# Patient Record
Sex: Female | Born: 1970 | Race: Asian | Hispanic: No | Marital: Married | Smoking: Former smoker
Health system: Southern US, Community
[De-identification: ages and names within clinical notes are randomized; demographics above are authoritative.]

## PROBLEM LIST (undated history)

## (undated) HISTORY — PX: TUBAL LIGATION: SHX77

---

## 2013-07-06 DIAGNOSIS — S8290XD Unspecified fracture of unspecified lower leg, subsequent encounter for closed fracture with routine healing: Secondary | ICD-10-CM | POA: Insufficient documentation

## 2013-07-06 DIAGNOSIS — M6281 Muscle weakness (generalized): Secondary | ICD-10-CM | POA: Insufficient documentation

## 2013-07-06 DIAGNOSIS — M222X1 Patellofemoral disorders, right knee: Secondary | ICD-10-CM | POA: Insufficient documentation

## 2017-11-19 ENCOUNTER — Emergency Department (HOSPITAL_COMMUNITY)
Admission: EM | Admit: 2017-11-19 | Discharge: 2017-11-19 | Disposition: A | Discharge status: Home / Self Care | Payer: 59 | Attending: Emergency Medicine | Admitting: Emergency Medicine

## 2017-11-19 ENCOUNTER — Encounter (HOSPITAL_COMMUNITY): Payer: Self-pay

## 2017-11-19 ENCOUNTER — Other Ambulatory Visit: Payer: Self-pay

## 2017-11-19 DIAGNOSIS — M62830 Muscle spasm of back: Secondary | ICD-10-CM | POA: Insufficient documentation

## 2017-11-19 DIAGNOSIS — Z87891 Personal history of nicotine dependence: Secondary | ICD-10-CM | POA: Diagnosis not present

## 2017-11-19 DIAGNOSIS — M549 Dorsalgia, unspecified: Secondary | ICD-10-CM | POA: Diagnosis not present

## 2017-11-19 MED ORDER — CYCLOBENZAPRINE HCL 10 MG PO TABS: 10.0000 mg | ORAL_TABLET | Freq: Two times a day (BID) | ORAL | 0 refills | Status: DC | PRN

## 2017-11-19 MED ORDER — KETOROLAC TROMETHAMINE 30 MG/ML IJ SOLN
15.0000 mg | Freq: Once | INTRAMUSCULAR | Status: AC
  Administered 2017-11-19: 15 mg via INTRAMUSCULAR
  Filled 2017-11-19: qty 1

## 2017-11-19 MED ORDER — PREDNISONE 10 MG PO TABS: 40.0000 mg | ORAL_TABLET | Freq: Every day | ORAL | 0 refills | Status: DC

## 2017-11-19 MED ORDER — HYDROCODONE-ACETAMINOPHEN 5-325 MG PO TABS
1.0000 | ORAL_TABLET | Freq: Once | ORAL | Status: AC
  Administered 2017-11-19: 1 via ORAL
  Filled 2017-11-19: qty 1

## 2017-11-19 MED ORDER — CYCLOBENZAPRINE HCL 10 MG PO TABS
10.0000 mg | ORAL_TABLET | Freq: Once | ORAL | Status: AC
  Administered 2017-11-19: 10 mg via ORAL
  Filled 2017-11-19: qty 1

## 2017-11-19 MED ORDER — PREDNISONE 10 MG PO TABS: 40.0000 mg | ORAL_TABLET | Freq: Every day | ORAL | 0 refills | Status: AC

## 2017-11-19 MED ORDER — DICLOFENAC SODIUM 50 MG PO TBEC: 50.0000 mg | DELAYED_RELEASE_TABLET | Freq: Two times a day (BID) | ORAL | 0 refills | Status: AC

## 2017-11-19 NOTE — ED Notes (Signed)
Pt stated that she has had a tubal ligation and last period 2 weeks ago

## 2017-11-19 NOTE — ED Triage Notes (Signed)
Pt reports low back pain with spasm. Pt reports having to sleep on a air mattress for about 1.5 weeks. Pt reports worsening back pain. Pt reports that she bent over at work today and began to have a back spasm. Pt denies numbness or tingling.

## 2017-11-19 NOTE — Discharge Instructions (Addendum)
Take the medication as directed. Do not drive while taking the muscle relaxer as it can make you sleepy. Follow up with your doctor or return here for worsening symptoms.

## 2017-11-19 NOTE — ED Provider Notes (Signed)
[facility]-EMERGENCY DEPT Provider Note   CSN: [id number] Arrival date & time: 11/19/17  1404     History   Chief Complaint Chief Complaint  Patient presents with  . Back Pain    HPI Haley Clark is a 47 y.o. female who presents to the ED with back pain and spasm. Patient reports having to sleep on an air mattress for over a week and the pain started doing that time. Patient bent over at work today and started having severe spasm. Patient denies numbness or tingling or loss of control of bladder or bowels.   HPI  History reviewed. No pertinent past medical history.  There are no active problems to display for this patient.   Past Surgical History:  Procedure Laterality Date  . TUBAL LIGATION       OB History   None      Home Medications    Prior to Admission medications   Medication Sig Start Date End Date Taking? Authorizing Provider  cyclobenzaprine (FLEXERIL) 10 MG tablet Take 1 tablet (10 mg total) by mouth 2 (two) times daily as needed for muscle spasms. 11/19/17   Janne Napoleon, NP  diclofenac (VOLTAREN) 50 MG EC tablet Take 1 tablet (50 mg total) by mouth 2 (two) times daily. 11/19/17   Janne Napoleon, NP  predniSONE (DELTASONE) 10 MG tablet Take 4 tablets (40 mg total) by mouth daily with breakfast. 11/19/17   Janne Napoleon, NP    Family History History reviewed. No pertinent family history.  Social History Social History   Tobacco Use  . Smoking status: Former Games developer  . Smokeless tobacco: Never Used  Substance Use Topics  . Alcohol use: Yes    Comment: occ  . Drug use: Never     Allergies   Patient has no known allergies.   Review of Systems Review of Systems  Musculoskeletal: Positive for back pain.  All other systems reviewed and are negative.    Physical Exam Updated Vital Signs BP 126/87 (BP Location: Left Arm)   Pulse 80   Temp 98.9 F (37.2 C) (Oral)   Resp 20   Ht 5\' 8"  (1.727 m)   Wt 55.8 kg   SpO2  100%   BMI 18.70 kg/m   Physical Exam  Constitutional: She is oriented to person, place, and time. She appears well-developed and well-nourished. No distress.  HENT:  Head: Normocephalic.  Eyes: Conjunctivae and EOM are normal.  Neck: Normal range of motion. Neck supple.  Cardiovascular: Normal rate.  Pulmonary/Chest: Effort normal.  Abdominal: Soft. There is no tenderness.  Musculoskeletal:       Lumbar back: She exhibits tenderness and spasm. She exhibits normal pulse. Decreased range of motion: due to pain.  Neurological: She is alert and oriented to person, place, and time. She has normal strength. She displays a negative Romberg sign. Gait normal.  Reflex Scores:      Bicep reflexes are 2+ on the right side and 2+ on the left side.      Brachioradialis reflexes are 2+ on the right side and 2+ on the left side.      Patellar reflexes are 2+ on the right side and 2+ on the left side. Skin: Skin is warm and dry.  Psychiatric: She has a normal mood and affect.  Nursing note and vitals reviewed.    ED Treatments / Results  Labs (all labs ordered are listed, but only abnormal results are displayed) Labs Reviewed -  No data to display  Radiology No results found.  Procedures Procedures (including critical care time)  Medications Ordered in ED Medications  cyclobenzaprine (FLEXERIL) tablet 10 mg (10 mg Oral Given 11/19/17 1548)  ketorolac (TORADOL) 30 MG/ML injection 15 mg (15 mg Intramuscular Given 11/19/17 1548)  HYDROcodone-acetaminophen (NORCO/VICODIN) 5-325 MG per tablet 1 tablet (1 tablet Oral Given 11/19/17 1708)   Patient's pain improved significantly with treatment in the ED.  Initial Impression / Assessment and Plan / ED Course  I have reviewed the triage vital signs and the nursing notes. Patient with back pain.  No neurological deficits and normal neuro exam.  Patient can walk but states is painful.  No loss of bowel or bladder control.  No concern for cauda equina.   No fever, night sweats, weight loss, h/o cancer, IVDU.  RICE protocol and pain medicine indicated and discussed with patient.   Final Clinical Impressions(s) / ED Diagnoses   Final diagnoses:  Spasm of muscle of lower back    ED Discharge Orders         Ordered    cyclobenzaprine (FLEXERIL) 10 MG tablet  2 times daily PRN,   Status:  Discontinued     11/19/17 1659    predniSONE (DELTASONE) 10 MG tablet  Daily with breakfast,   Status:  Discontinued     11/19/17 1659    diclofenac (VOLTAREN) 50 MG EC tablet  2 times daily     11/19/17 1659    cyclobenzaprine (FLEXERIL) 10 MG tablet  2 times daily PRN     11/19/17 1801    predniSONE (DELTASONE) 10 MG tablet  Daily with breakfast     11/19/17 1801           Kerrie Buffaloeese, Tamisha Nordstrom [place], NP 11/19/17 2147    Pricilla LovelessGoldston, Scott, MD 11/20/17 [phone]

## 2017-11-23 ENCOUNTER — Other Ambulatory Visit: Payer: Self-pay

## 2017-11-23 ENCOUNTER — Emergency Department (HOSPITAL_COMMUNITY)
Admission: EM | Admit: 2017-11-23 | Discharge: 2017-11-23 | Disposition: A | Discharge status: Home / Self Care | Payer: 59 | Attending: Emergency Medicine | Admitting: Emergency Medicine

## 2017-11-23 ENCOUNTER — Encounter (HOSPITAL_COMMUNITY): Payer: Self-pay | Admitting: *Deleted

## 2017-11-23 DIAGNOSIS — M545 Low back pain, unspecified: Secondary | ICD-10-CM

## 2017-11-23 DIAGNOSIS — Z87891 Personal history of nicotine dependence: Secondary | ICD-10-CM | POA: Insufficient documentation

## 2017-11-23 MED ORDER — HYDROCODONE-ACETAMINOPHEN 5-325 MG PO TABS: 1.0000 | ORAL_TABLET | ORAL | 0 refills | Status: AC | PRN

## 2017-11-23 MED ORDER — CYCLOBENZAPRINE HCL 10 MG PO TABS: 10.0000 mg | ORAL_TABLET | Freq: Three times a day (TID) | ORAL | 0 refills | Status: AC | PRN

## 2017-11-23 NOTE — ED Triage Notes (Signed)
Pt arrives ambulatory to triage room with c/o right lower back pain. Pt was seen here on the 8th after having back spasms when she bent down. She says she has also been sleeping on an air mattress for about a week. She has been taking prescribed medications, pain is not improved.  No numbness or tingling in extremities. She is tachycardic in triage 130-140's, afebrile.

## 2017-11-23 NOTE — Discharge Instructions (Signed)
Use heat on the low back 3 or 4 times a day to help the discomfort.  Start gently stretching her back.  Call the orthopedic doctor for follow-up if not better in 2 weeks.

## 2017-11-23 NOTE — ED Provider Notes (Signed)
[facility]-EMERGENCY DEPT Provider Note   CSN: [id number] Arrival date & time: 11/23/17  1939     History   Chief Complaint Chief Complaint  Patient presents with  . Back Pain    HPI Haley Clark is a 47 y.o. female.  HPI  She presents for evaluation of ongoing low back pain which started 5 days ago after she bent over to pick something up in a cooler.  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  She has persistent pain despite being treated, here with multiple medications.  These include nonsteroidals, steroids, Flexeril.  She continues to work but is having persistent pain.  She denies bowel or urinary incontinence.  She is able to ambulate.  There are no other known modifying factors.  History reviewed. No pertinent past medical history.  There are no active problems to display for this patient.   Past Surgical History:  Procedure Laterality Date  . TUBAL LIGATION       OB History   None      Home Medications    Prior to Admission medications   Medication Sig Start Date End Date Taking? Authorizing Provider  cyclobenzaprine (FLEXERIL) 10 MG tablet Take 1 tablet (10 mg total) by mouth 3 (three) times daily as needed for muscle spasms. 11/23/17   Mancel BaleWentz, Luanne Krzyzanowski, MD  diclofenac (VOLTAREN) 50 MG EC tablet Take 1 tablet (50 mg total) by mouth 2 (two) times daily. 11/19/17   Janne NapoleonNeese, Hope M, NP  HYDROcodone-acetaminophen (NORCO) 5-325 MG tablet Take 1-2 tablets by mouth every 4 (four) hours as needed. 11/23/17   Mancel BaleWentz, Danecia Underdown, MD  predniSONE (DELTASONE) 10 MG tablet Take 4 tablets (40 mg total) by mouth daily with breakfast. 11/19/17   Janne NapoleonNeese, Hope M, NP    Family History No family history on file.  Social History Social History   Tobacco Use  . Smoking status: Former Games developermoker  . Smokeless tobacco: Never Used  Substance Use Topics  . Alcohol use: Yes    Comment: occ  . Drug use: Never     Allergies   Patient has no known allergies.   Review of  Systems Review of Systems  All other systems reviewed and are negative.    Physical Exam Updated Vital Signs BP 112/80 (BP Location: Right Arm)   Pulse (!) 125   Temp 98.7 F (37.1 C) (Oral)   Resp 18   Ht 5\' 8"  (1.727 m)   Wt 55.8 kg   SpO2 99%   BMI 18.70 kg/m   Physical Exam  Constitutional: She is oriented to person, place, and time. She appears well-developed and well-nourished. No distress.  HENT:  Head: Normocephalic and atraumatic.  Eyes: Pupils are equal, round, and reactive to light. Conjunctivae and EOM are normal.  Neck: Normal range of motion and phonation normal. Neck supple.  Cardiovascular: Normal rate.  Pulmonary/Chest: Effort normal.  Musculoskeletal:  She resists extension of the low back secondary to pain.  She has mild lumbar tenderness to palpation.  Normal gait.  Neurological: She is alert and oriented to person, place, and time. She exhibits normal muscle tone.  Skin: Skin is warm and dry.  Psychiatric: She has a normal mood and affect. Her behavior is normal. Judgment and thought content normal.  Nursing note and vitals reviewed.    ED Treatments / Results  Labs (all labs ordered are listed, but only abnormal results are displayed) Labs Reviewed - No data to display  EKG None  Radiology  No results found.  Procedures Procedures (including critical care time)  Medications Ordered in ED Medications - No data to display   Initial Impression / Assessment and Plan / ED Course  I have reviewed the triage vital signs and the nursing notes.  Pertinent labs & imaging results that were available during my care of the patient were reviewed by me and considered in my medical decision making (see chart for details).      Patient Vitals for the past 24 hrs:  BP Temp Temp src Pulse Resp SpO2 Height Weight  11/23/17 2014 112/80 - - (!) 125 18 99 % - -  11/23/17 2008 120/84 98.7 F (37.1 C) Oral (!) 139 17 99 % 5\' 8"  (1.727 m) 55.8 kg     8:57 PM Reevaluation with update and discussion. After initial assessment and treatment, an updated evaluation reveals no change in clinical status.  Findings discussed with patient, and family member, all questions answered. Mancel BaleElliott Jeptha Hinnenkamp   Medical Decision Making: Ongoing back pain following flexion and squatting incident.  Doubt lumbar radiculopathy or myelopathy.  Doubt fracture.  Patient is ambulatory.  CRITICAL CARE-no Performed by: Mancel BaleElliott Tehya Leath  Nursing Notes Reviewed/ Care Coordinated Applicable Imaging Reviewed Interpretation of Laboratory Data incorporated into ED treatment  The patient appears reasonably screened and/or stabilized for discharge and I doubt any other medical condition or other [facility] requiring further screening, evaluation, or treatment in the ED at this time prior to discharge.  Plan: Home Medications-continue current medications; Home Treatments-rest, heat, gradually advance activity; return here if the recommended treatment, does not improve the symptoms; Recommended follow up-orthopedic follow-up 1 to 2 weeks and as needed.   Final Clinical Impressions(s) / ED Diagnoses   Final diagnoses:  Acute bilateral low back pain without sciatica    ED Discharge Orders         Ordered    HYDROcodone-acetaminophen (NORCO) 5-325 MG tablet  Every 4 hours PRN     11/23/17 2048    cyclobenzaprine (FLEXERIL) 10 MG tablet  3 times daily PRN     11/23/17 2048           Mancel BaleWentz, Geddy [place]un, MD 11/23/17 [id number]

## 2017-11-26 DIAGNOSIS — M5440 Lumbago with sciatica, unspecified side: Secondary | ICD-10-CM | POA: Diagnosis not present

## 2017-12-03 DIAGNOSIS — M62838 Other muscle spasm: Secondary | ICD-10-CM | POA: Diagnosis not present

## 2018-01-13 DIAGNOSIS — M545 Low back pain, unspecified: Secondary | ICD-10-CM | POA: Insufficient documentation

## 2018-01-13 DIAGNOSIS — M47817 Spondylosis without myelopathy or radiculopathy, lumbosacral region: Secondary | ICD-10-CM | POA: Diagnosis not present

## 2018-06-15 ENCOUNTER — Encounter (HOSPITAL_COMMUNITY): Payer: Self-pay

## 2018-06-15 ENCOUNTER — Emergency Department (HOSPITAL_COMMUNITY): Payer: 59

## 2018-06-15 ENCOUNTER — Emergency Department (HOSPITAL_COMMUNITY)
Admission: EM | Admit: 2018-06-15 | Discharge: 2018-06-16 | Disposition: A | Discharge status: Home / Self Care | Payer: 59 | Attending: Emergency Medicine | Admitting: Emergency Medicine

## 2018-06-15 ENCOUNTER — Other Ambulatory Visit: Payer: Self-pay

## 2018-06-15 DIAGNOSIS — M79642 Pain in left hand: Secondary | ICD-10-CM | POA: Diagnosis not present

## 2018-06-15 DIAGNOSIS — M25532 Pain in left wrist: Secondary | ICD-10-CM | POA: Diagnosis not present

## 2018-06-15 DIAGNOSIS — Y929 Unspecified place or not applicable: Secondary | ICD-10-CM | POA: Diagnosis not present

## 2018-06-15 DIAGNOSIS — W2209XA Striking against other stationary object, initial encounter: Secondary | ICD-10-CM | POA: Diagnosis not present

## 2018-06-15 DIAGNOSIS — R202 Paresthesia of skin: Secondary | ICD-10-CM | POA: Diagnosis not present

## 2018-06-15 DIAGNOSIS — Y999 Unspecified external cause status: Secondary | ICD-10-CM | POA: Diagnosis not present

## 2018-06-15 DIAGNOSIS — R600 Localized edema: Secondary | ICD-10-CM | POA: Insufficient documentation

## 2018-06-15 DIAGNOSIS — Y939 Activity, unspecified: Secondary | ICD-10-CM | POA: Diagnosis not present

## 2018-06-15 DIAGNOSIS — M7989 Other specified soft tissue disorders: Secondary | ICD-10-CM | POA: Diagnosis not present

## 2018-06-15 DIAGNOSIS — M79645 Pain in left finger(s): Secondary | ICD-10-CM | POA: Insufficient documentation

## 2018-06-15 DIAGNOSIS — S6992XA Unspecified injury of left wrist, hand and finger(s), initial encounter: Secondary | ICD-10-CM | POA: Diagnosis not present

## 2018-06-15 MED ORDER — IBUPROFEN 400 MG PO TABS
600.0000 mg | ORAL_TABLET | Freq: Once | ORAL | Status: AC
  Administered 2018-06-15: 600 mg via ORAL
  Filled 2018-06-15: qty 1

## 2018-06-15 NOTE — ED Provider Notes (Signed)
[facility] EMERGENCY DEPARTMENT Provider Note   CSN: [id number] Arrival date & time: 06/15/18  1930    History   Chief Complaint Chief Complaint  Patient presents with  . Wrist Pain    HPI Haley Clark is a 48 y.o. female who presents with left wrist pain after almost falling and catching herself on the wall.  She reports she smacked her posterior wrist on a wall.  She has had swelling over her dorsal ulnar aspect and tenderness.  She reports some pain in her fourth and fifth digits as well as pins-and-needles.  She has full range of motion of the hand.  No interventions taken prior to arrival.  She did not hit her head or lose consciousness.  She denies any other injuries.  Patient is right-handed.     HPI  History reviewed. No pertinent past medical history.  There are no active problems to display for this patient.   Past Surgical History:  Procedure Laterality Date  . TUBAL LIGATION       OB History   No obstetric history on file.      Home Medications    Prior to Admission medications   Medication Sig Start Date End Date Taking? Authorizing Provider  acetaminophen (TYLENOL) 500 MG tablet Take 1 tablet (500 mg total) by mouth every 6 (six) hours as needed. 06/16/18   Izaiyah Kleinman, Waylan Boga, PA-C  meloxicam (MOBIC) 7.5 MG tablet Take 1 tablet (7.5 mg total) by mouth daily. 06/16/18   Renel Ende, Waylan Boga, PA-C  methocarbamol (ROBAXIN) 500 MG tablet Take 1 tablet (500 mg total) by mouth 2 (two) times daily. 06/16/18   Emi Holes, PA-C    Family History No family history on file.  Social History Social History   Tobacco Use  . Smoking status: Not on file  Substance Use Topics  . Alcohol use: Not on file  . Drug use: Not on file     Allergies   Patient has no known allergies.   Review of Systems Review of Systems  Musculoskeletal: Positive for arthralgias.  Neurological: Positive for numbness (paresthesia). Negative for syncope.      Physical Exam Updated Vital Signs BP 128/86 (BP Location: Right Arm)   Pulse 74   Temp 98.2 F (36.8 C)   Resp 16   LMP  (LMP Unknown) Comment: tubal  SpO2 99%   Physical Exam Vitals signs and nursing note reviewed.  Constitutional:      General: She is not in acute distress.    Appearance: She is well-developed. She is not diaphoretic.  HENT:     Head: Normocephalic and atraumatic.     Mouth/Throat:     Pharynx: No oropharyngeal exudate.  Eyes:     General: No scleral icterus.       Right eye: No discharge.        Left eye: No discharge.     Conjunctiva/sclera: Conjunctivae normal.     Pupils: Pupils are equal, round, and reactive to light.  Neck:     Musculoskeletal: Normal range of motion and neck supple.     Thyroid: No thyromegaly.  Cardiovascular:     Rate and Rhythm: Normal rate and regular rhythm.     Heart sounds: Normal heart sounds. No murmur. No friction rub. No gallop.   Pulmonary:     Effort: Pulmonary effort is normal. No respiratory distress.     Breath sounds: Normal breath sounds. No stridor. No wheezing or rales.  Abdominal:     General: Bowel sounds are normal. There is no distension.     Palpations: Abdomen is soft.     Tenderness: There is no abdominal tenderness. There is no guarding or rebound.  Musculoskeletal:     Comments: L wrist/hand: Erythema, tenderness, edema over distal ulna, as well as the fifth and fourth metacarpal and digits, sensation intact, cap refill less than 2 seconds, no anatomical snuffbox tenderness; range of motion intact; radial pulse intact  Lymphadenopathy:     Cervical: No cervical adenopathy.  Skin:    General: Skin is warm and dry.     Coloration: Skin is not pale.     Findings: No rash.  Neurological:     Mental Status: She is alert.     Coordination: Coordination normal.      ED Treatments / Results  Labs (all labs ordered are listed, but only abnormal results are displayed) Labs Reviewed - No data  to display  EKG None  Radiology Dg Wrist Complete Left  Result Date: 06/15/2018 CLINICAL DATA:  Left wrist pain with knot along the ulnar side of the wrist. EXAM: LEFT WRIST - COMPLETE 3+ VIEW COMPARISON:  None. FINDINGS: There is no evidence of fracture or dislocation. There is no evidence of arthropathy or other focal bone abnormality. Mild soft tissue swelling over the dorsum of the wrist. IMPRESSION: Soft tissue swelling over the dorsum of the wrist. No acute fracture or joint dislocation. Electronically Signed   By: Tollie Eth M.D.   On: 06/15/2018 20:10   Dg Hand Complete Left  Result Date: 06/15/2018 CLINICAL DATA:  Fall EXAM: LEFT HAND - COMPLETE 3+ VIEW COMPARISON:  06/15/2018 FINDINGS: There is no evidence of fracture or dislocation. There is no evidence of arthropathy or other focal bone abnormality. Soft tissues are unremarkable. Metallic ring fourth digit. IMPRESSION: No acute osseous abnormality. Electronically Signed   By: Jasmine Pang M.D.   On: 06/15/2018 23:55    Procedures Procedures (including critical care time)  Medications Ordered in ED Medications  ibuprofen (ADVIL,MOTRIN) tablet 600 mg (600 mg Oral Given 06/15/18 2239)     Initial Impression / Assessment and Plan / ED Course  I have reviewed the triage vital signs and the nursing notes.  Pertinent labs & imaging results that were available during my care of the patient were reviewed by me and considered in my medical decision making (see chart for details).        Patient presenting with left wrist and hand pain after slamming her hand against the wall to catch himself falling.  She has no anatomical snuffbox tenderness.  X-rays of the left hand and wrist are negative for fracture but soft tissue swelling seen over the dorsum of the wrist.  Ice, NSAIDs, brace discussed and provided in the ED.  Patient also requests a muscle relaxer as she feels like her muscles in her wrist and hand are spasming.  Will  discharge with Robaxin.  Patient given follow-up to sports medicine as needed if symptoms not improving.  Return precautions discussed.  Patient understands and agrees with plan.  Patient vital stable throughout ED course and discharged in satisfactory condition.  Final Clinical Impressions(s) / ED Diagnoses   Final diagnoses:  Left wrist pain    ED Discharge Orders         Ordered    meloxicam (MOBIC) 7.5 MG tablet  Daily     06/16/18 0022    acetaminophen (TYLENOL) 500 MG tablet  Every 6 hours PRN     06/16/18 0022    methocarbamol (ROBAXIN) 500 MG tablet  2 times daily     06/16/18 0022           Emi Holes, PA-C 06/16/18 0059    Virgina Norfolk, DO 06/16/18 0120

## 2018-06-15 NOTE — ED Notes (Signed)
Pt transported to Xray. 

## 2018-06-15 NOTE — ED Triage Notes (Signed)
Pt states that she fell and hit her L wrist today, redness and swelling noted.

## 2018-06-16 ENCOUNTER — Encounter (HOSPITAL_COMMUNITY): Payer: Self-pay | Admitting: *Deleted

## 2018-06-16 MED ORDER — MELOXICAM 7.5 MG PO TABS: 7.5000 mg | ORAL_TABLET | Freq: Every day | ORAL | 0 refills | Status: AC

## 2018-06-16 MED ORDER — ACETAMINOPHEN 500 MG PO TABS: 500.0000 mg | ORAL_TABLET | Freq: Four times a day (QID) | ORAL | 0 refills | Status: AC | PRN

## 2018-06-16 MED ORDER — METHOCARBAMOL 500 MG PO TABS: 500.0000 mg | ORAL_TABLET | Freq: Two times a day (BID) | ORAL | 0 refills | Status: AC

## 2018-06-16 NOTE — Discharge Instructions (Addendum)
Take Mobic once daily for your pain.  You can alternate with Tylenol as prescribed, as needed for pain throughout the day.  Take Robaxin twice daily as needed for muscle pain or spasms.  Do not drive or operate machinery while taking this medication.  Use ice 3-4 times daily alternating 20 minutes on, 20 minutes off.  Use your wrist brace for comfort.  Please return the emergency department if you develop any new or worsening symptoms.  If your symptoms are not improving over the next week, you can follow-up with Dr. Katrinka Blazing, the sports medicine doctor, for further evaluation and treatment.

## 2018-06-25 ENCOUNTER — Ambulatory Visit: Payer: 59 | Admitting: Family Medicine

## 2018-06-28 ENCOUNTER — Other Ambulatory Visit: Payer: Self-pay

## 2018-06-28 ENCOUNTER — Encounter: Payer: Self-pay | Admitting: Family Medicine

## 2018-06-28 ENCOUNTER — Ambulatory Visit (INDEPENDENT_AMBULATORY_CARE_PROVIDER_SITE_OTHER): Payer: 59 | Admitting: Family Medicine

## 2018-06-28 ENCOUNTER — Ambulatory Visit (INDEPENDENT_AMBULATORY_CARE_PROVIDER_SITE_OTHER): Payer: 59

## 2018-06-28 VITALS — BP 126/82 | HR 112 | Temp 98.6°F | Ht 68.0 in | Wt 144.2 lb

## 2018-06-28 DIAGNOSIS — M25532 Pain in left wrist: Secondary | ICD-10-CM

## 2018-06-28 MED ORDER — DICLOFENAC SODIUM 2 % TD SOLN: 1.0000 "application " | Freq: Two times a day (BID) | TRANSDERMAL | 3 refills | Status: AC

## 2018-06-28 NOTE — Patient Instructions (Signed)
Nice to meet you  Please try the range of motion exercises when you can  Please try the pennsaid Please use the brace while at work  Please see me back in 2 weeks and we can see how you're doing.

## 2018-06-28 NOTE — Progress Notes (Signed)
Haley Clark - 48 y.o. female MRN [id number]  Date of birth: 09/26/1970  SUBJECTIVE:  Including CC & ROS.  Chief Complaint  Patient presents with  . Pain    left wrist pain/ almost fell at [employer] caught herself with left wrist/ hospital gave brace,/ soaks in alcohol/ ice and elevates/ worse since fall    Haley Clark is a 48 y.o. female that is  Presenting with left hand pain. She was at [facility] and had an accident on 3/3. She was seen in the ED and xrays were normal. She was placed in a wrist brace. Since that time she is having dorsal hand pain and ulna sided hand pain and numbness. Pain is sharp. Has taken muscle relaxer and prednisone. Pain is worse if she uses her hand but pain can occurred spontaneously. .  Independent review of the left hand x-ray from 3/3 shows no acute abnormalities.  Independent review of the left wrist x-ray shows no fracture.   Review of Systems  Constitutional: Negative for fever.  HENT: Negative for congestion.   Respiratory: Negative for cough.   Cardiovascular: Negative for chest pain.  Gastrointestinal: Negative for abdominal distention.  Musculoskeletal: Negative for joint swelling.  Skin: Negative for color change.  Neurological: Positive for weakness and numbness.  Hematological: Negative for adenopathy.    HISTORY: Past Medical, Surgical, Social, and Family History Reviewed & Updated per EMR.   Pertinent Historical Findings include:  No past medical history on file.  Past Surgical History:  Procedure Laterality Date  . TUBAL LIGATION      No Known Allergies  No family history on file.   Social History   Socioeconomic History  . Marital status: Married    Spouse name: Not on file  . Number of children: Not on file  . Years of education: Not on file  . Highest education level: Not on file  Occupational History  . Not on file  Social Needs  . Financial resource strain: Not on file  . Food insecurity:    Worry:  Not on file    Inability: Not on file  . Transportation needs:    Medical: Not on file    Non-medical: Not on file  Tobacco Use  . Smoking status: Former Games developer  . Smokeless tobacco: Never Used  Substance and Sexual Activity  . Alcohol use: Yes    Comment: occ  . Drug use: Never  . Sexual activity: Not on file  Lifestyle  . Physical activity:    Days per week: Not on file    Minutes per session: Not on file  . Stress: Not on file  Relationships  . Social connections:    Talks on phone: Not on file    Gets together: Not on file    Attends religious service: Not on file    Active member of club or organization: Not on file    Attends meetings of clubs or organizations: Not on file    Relationship status: Not on file  . Intimate partner violence:    Fear of current or ex partner: Not on file    Emotionally abused: Not on file    Physically abused: Not on file    Forced sexual activity: Not on file  Other Topics Concern  . Not on file  Social History Narrative   ** Merged History Encounter **         PHYSICAL EXAM:  VS: BP 126/82   Pulse (!) 112  Temp 98.6 F (37 C) (Oral)   Ht 5\' 8"  (1.727 m)   Wt 144 lb 3.2 oz (65.4 kg)   LMP  (LMP Unknown) Comment: tubal  SpO2 93%   BMI 21.93 kg/m  Physical Exam Gen: NAD, alert, cooperative with exam, well-appearing ENT: normal lips, normal nasal mucosa,  Eye: normal EOM, normal conjunctiva and lids CV:  no edema, +2 pedal pulses   Resp: no accessory muscle use, non-labored,  Skin: no rashes, no areas of induration  Neuro: normal tone, normal sensation to touch Psych:  normal insight, alert and oriented MSK:  Left hand:  No abnormalities  No ecchymosis or swelling  Normal ROM  No malrotation or misalignment  Some weakness with finger abduction in the 5th digit  Normal adduction strength to resistance  Neurovascularly intact   Limited ultrasound: left hand/wrist:  Normal appearing 4th and 5th MC Normal appearing  4th and 5th MCP joint  No soft tissue swelling  Normal appearing dorsal wrist compartments.  Normal appearing ulnar nerve in Guyon's canal  Summary: normal exam   Ultrasound and interpretation by Clare Gandy, MD       ASSESSMENT & PLAN:   Left wrist pain Possible for wrist sprain versus irritation of the nerve.  Reports some numbness today but no signs of changes within Guyon's canal.  Exam inconsistent at times. -Pennsaid. -Counseled on home exercise therapy and supportive care. -Continue brace while at work -Provided work note -Follow-up in 2 to 3 weeks.  If no improvement consider MRI

## 2018-06-28 NOTE — Assessment & Plan Note (Signed)
Possible for wrist sprain versus irritation of the nerve.  Reports some numbness today but no signs of changes within Guyon's canal.  Exam inconsistent at times. -Pennsaid. -Counseled on home exercise therapy and supportive care. -Continue brace while at work -Provided work note -Follow-up in 2 to 3 weeks.  If no improvement consider MRI

## 2018-07-12 ENCOUNTER — Ambulatory Visit: Payer: 59 | Admitting: Family Medicine

## 2018-07-27 ENCOUNTER — Ambulatory Visit: Payer: 59 | Admitting: Family Medicine

## 2018-07-27 ENCOUNTER — Telehealth: Payer: Self-pay

## 2018-07-27 NOTE — Telephone Encounter (Signed)
Questions for Screening COVID-19  Symptom onset: n/a  Travel or Contacts: no travel/ no contact  During this illness, did/does the patient experience any of the following symptoms? Fever >100.93F []   Yes [x]   No []   Unknown Subjective fever (felt feverish) []   Yes [x]   No []   Unknown Chills []   Yes [x]   No []   Unknown Muscle aches (myalgia) []   Yes [x]   No []   Unknown Runny nose (rhinorrhea) []   Yes [x]   No []   Unknown Sore throat []   Yes [x]   No []   Unknown Cough (new onset or worsening of chronic cough) []   Yes [x]   No []   Unknown Shortness of breath (dyspnea) []   Yes [x]   No []   Unknown Nausea or vomiting []   Yes [x]   No []   Unknown Headache []   Yes [x]   No []   Unknown Abdominal pain  []   Yes [x]   No []   Unknown Diarrhea (?3 loose/looser than normal stools/24hr period) []   Yes [x]   No []   Unknown Other, specify:  Patient risk factors: Smoker? []   Current [x]   Former []   Never If female, currently pregnant? []   Yes [x]   No  Patient Active Problem List   Diagnosis Date Noted  . Left wrist pain 06/28/2018  . Low back pain 01/13/2018  . Lumbosacral spondylosis without myelopathy 01/13/2018  . Aftercare for healing traumatic fracture of lower leg 07/06/2013  . 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 Quadriceps weakness 07/06/2013    Plan:  []   High risk for COVID-19 with red flags go to ED (with CP, SOB, weak/lightheaded, or fever > 101.5). Call ahead.  []   High risk for COVID-19 but stable. Inform provider and coordinate time for [facility] visit.   []   No red flags but URI signs or symptoms okay for [facility] visit.

## 2018-07-29 ENCOUNTER — Ambulatory Visit: Payer: 59 | Admitting: Family Medicine

## 2018-08-23 ENCOUNTER — Telehealth: Payer: Self-pay

## 2018-08-23 NOTE — Telephone Encounter (Signed)
Copied from CRM [id number]. Topic: General - Other >> Aug 23, 2018  2:05 PM Randol Kern wrote: Reason for CRM: Pt called and is requesting call back as soon as possible. She has questions about her MRI, please advise  Best Contact: [phone]

## 2018-08-23 NOTE — Telephone Encounter (Signed)
Dr. Jordan Likes please advise I don't see where an MRI was done.

## 2018-08-24 NOTE — Telephone Encounter (Signed)
Spoke with patient about her wrist. Will set up a virtual visit.   Haley Rude, MD [facility] 08/24/2018, 10:10 AM

## 2018-08-24 NOTE — Telephone Encounter (Signed)
Pt set up for doxy.me appointment 5/13

## 2018-08-25 ENCOUNTER — Ambulatory Visit: Payer: Self-pay | Admitting: Family Medicine

## 2018-08-25 NOTE — Progress Notes (Unsigned)
Virtual Visit via Video Note  I connected with Haley Clark on 08/25/18 at 11:10 AM EDT by a video enabled telemedicine application and verified that I am speaking with the correct person using two identifiers.   I discussed the limitations of evaluation and management by telemedicine and the availability of in person appointments. The patient expressed understanding and agreed to proceed.  History of Present Illness:    Observations/Objective:   Assessment and Plan:   Follow Up Instructions:    I discussed the assessment and treatment plan with the patient. The patient was provided an opportunity to ask questions and all were answered. The patient agreed with the plan and demonstrated an understanding of the instructions.   The patient was advised to call back or seek an in-person evaluation if the symptoms worsen or if the condition fails to improve as anticipated.  I provided *** minutes of non-face-to-face time during this encounter.   Clare Gandy, MD

## 2018-08-26 ENCOUNTER — Telehealth: Payer: Self-pay

## 2018-08-26 NOTE — Telephone Encounter (Signed)
Spoke with Pt. Made pt aware that I was unsure how much the MRI would cost. Gave pt a number to call to get break down of MRI cost. Pt will call back and schedule appointment once she gets cost broken down.

## 2018-08-26 NOTE — Telephone Encounter (Signed)
Copied from CRM [phone]. Topic: General - Inquiry >> Aug 26, 2018  9:43 AM Randol Kern wrote: Reason for CRM: Pt called requesting that Danielle email her a full cost break down of MRI, pt has decided to bypass insurance and pay for MRI out of pocket. She also states that she tried to return Dr. Michaelle Copas phone call but was unable to get connected. She is interested in getting scheduled as soon as she learns the full cost break down of her MRI. Please advise  Email: [email or web address]

## 2020-01-20 IMAGING — CR DG HAND COMPLETE 3+V*L*
3 series · 3 of 3 positions shown · non-contrast
Comparison: 06/15/2018

CLINICAL DATA: Fall

EXAM:
LEFT HAND - COMPLETE 3+ VIEW

[hand pa]
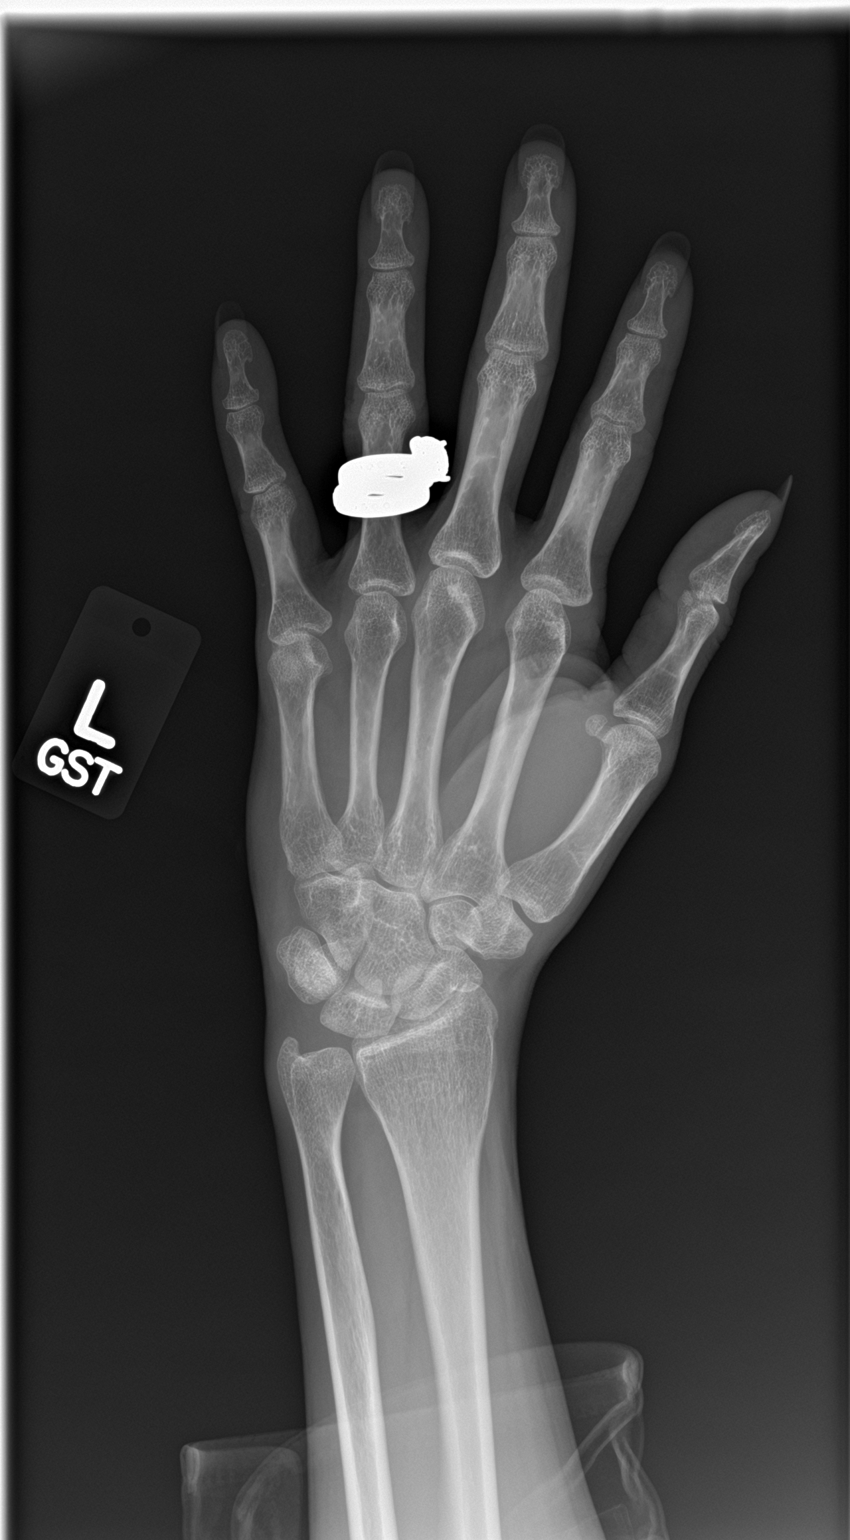

[hand obl]
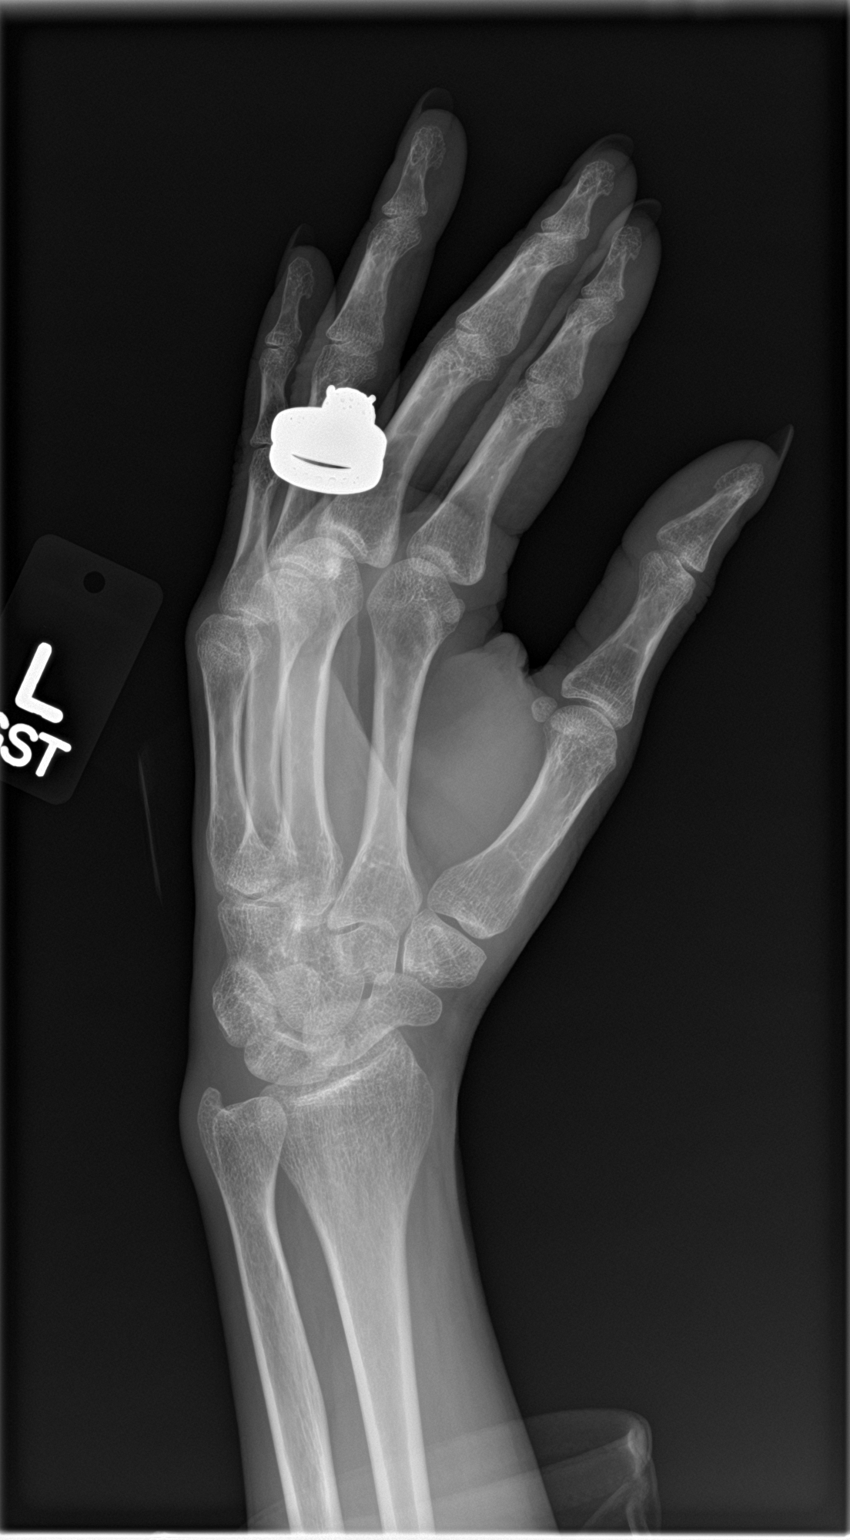

[hand lat]
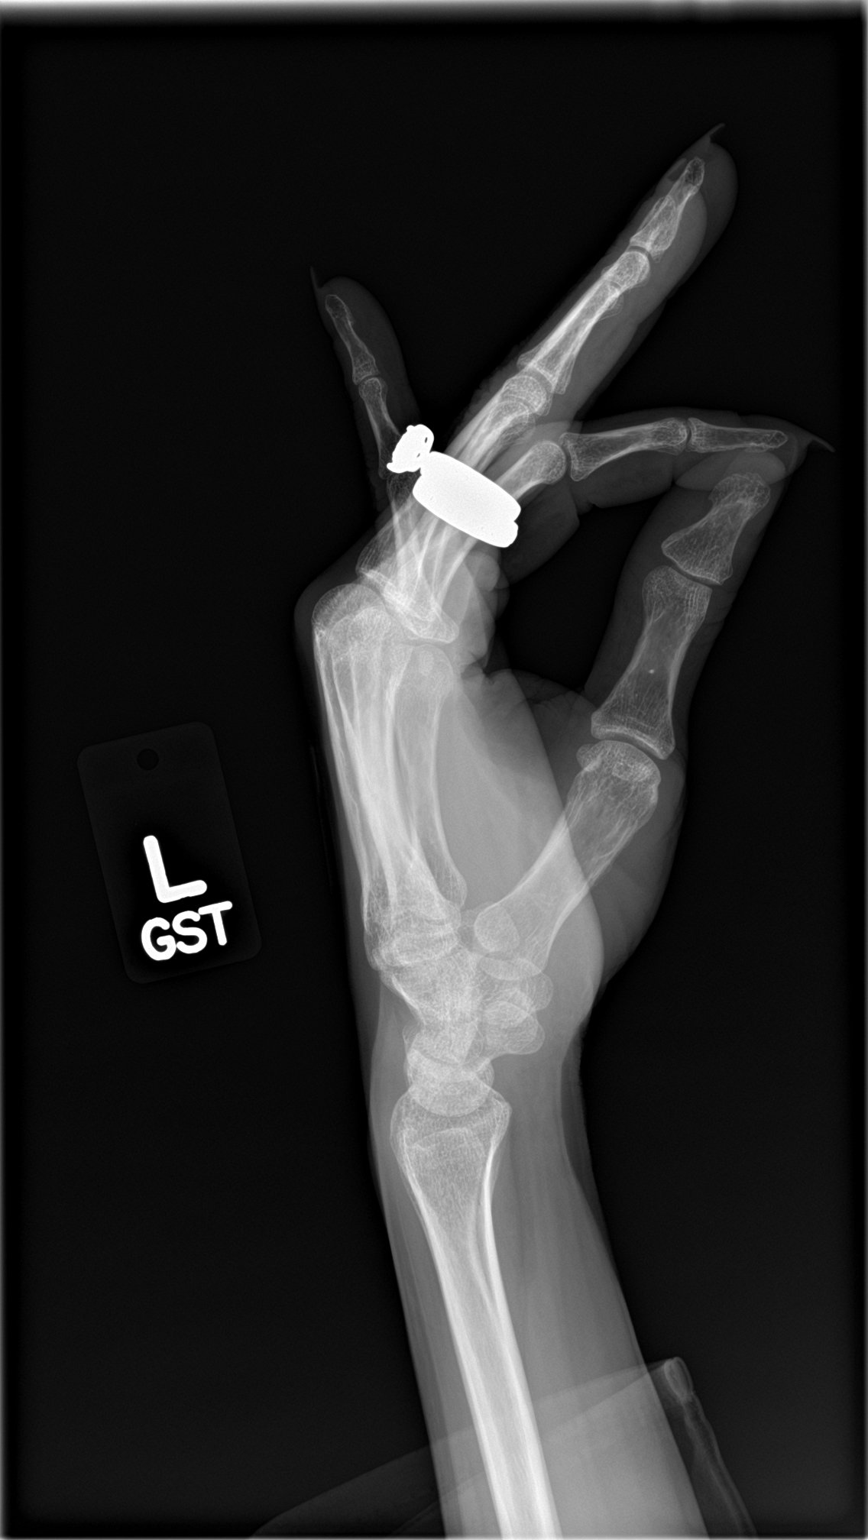

[3 of 3 positions shown; findings below may reference images not displayed]

FINDINGS: There is no evidence of fracture or dislocation. There is no
evidence of arthropathy or other focal bone abnormality. Soft
tissues are unremarkable. Metallic ring fourth digit.
IMPRESSION: No acute osseous abnormality.

## 2020-01-20 IMAGING — CR DG WRIST COMPLETE 3+V*L*
4 series · 4 of 4 positions shown · non-contrast
Comparison: None.

CLINICAL DATA: Left wrist pain with knot along the ulnar side of
the wrist.

EXAM:
LEFT WRIST - COMPLETE 3+ VIEW

[wrist pa]
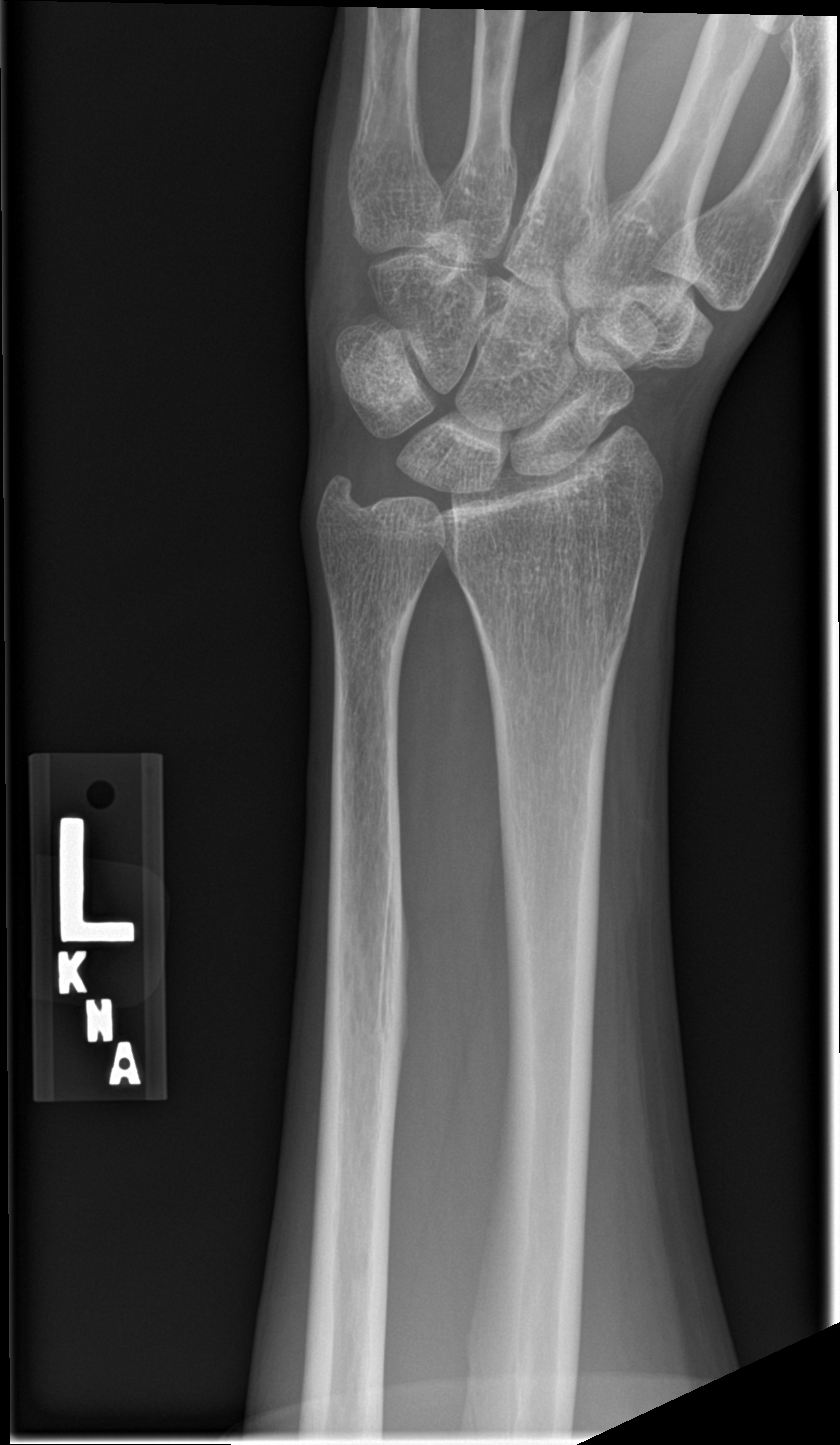

[wrist obl]
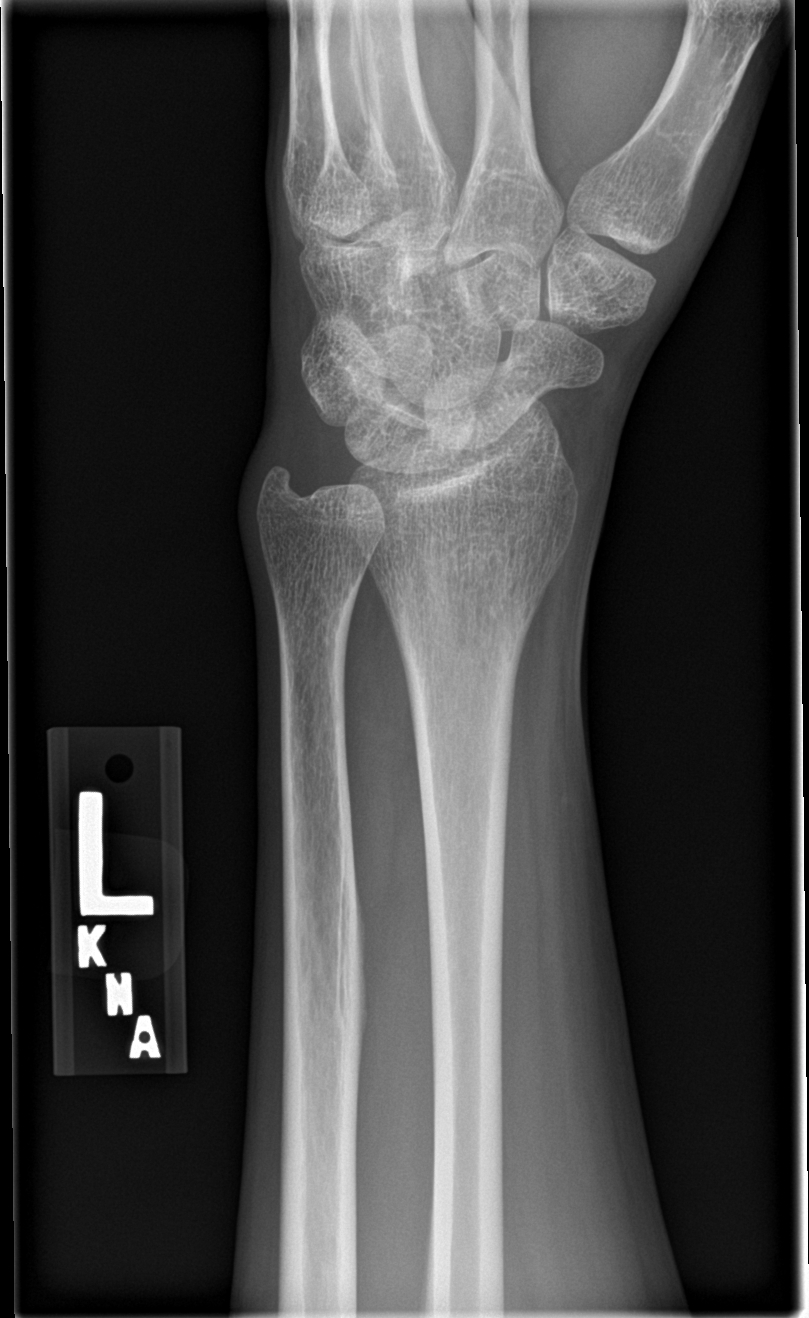

[wrist lat]
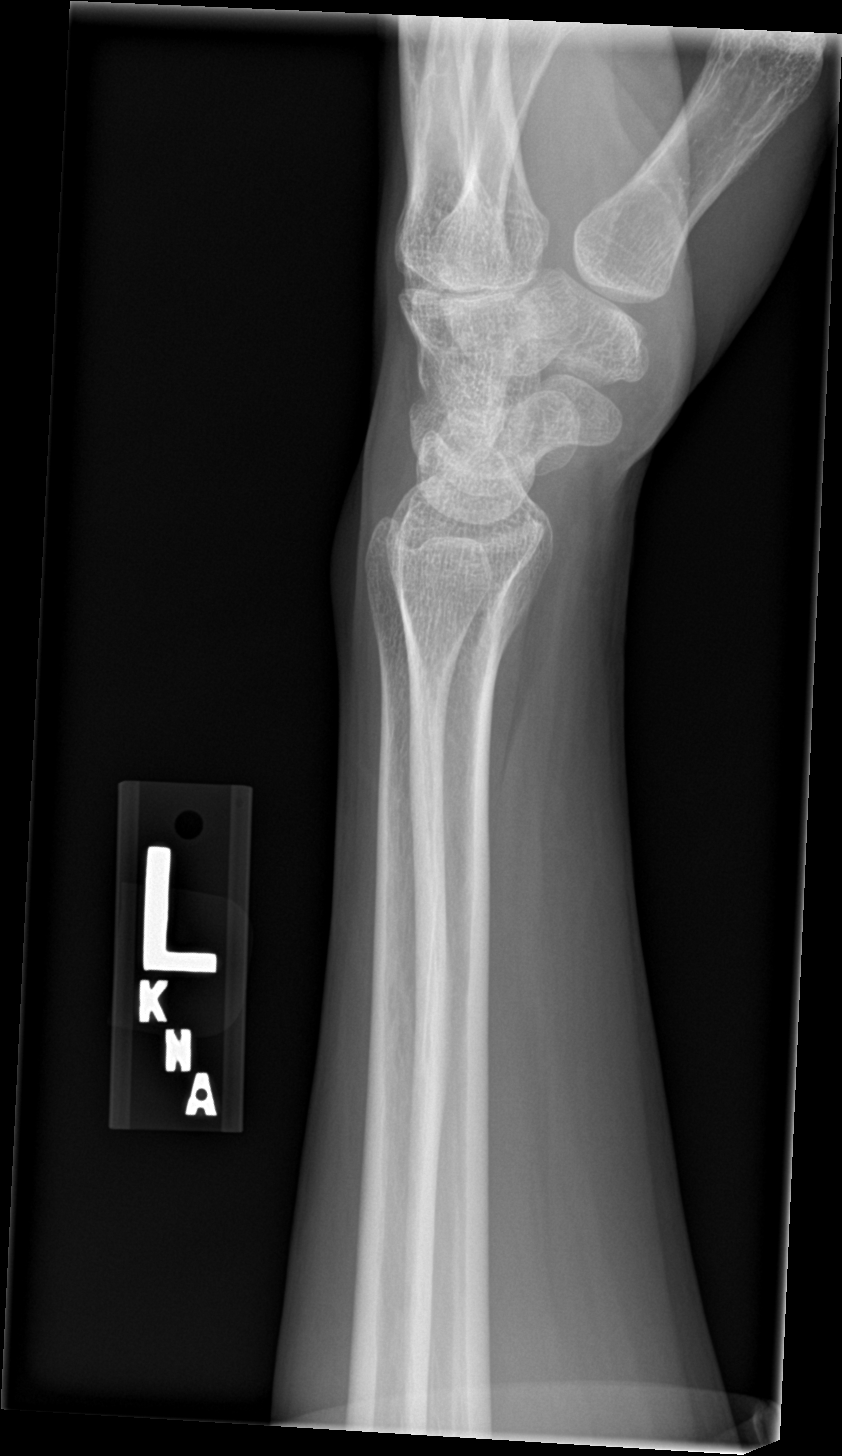

[wrist navicular]
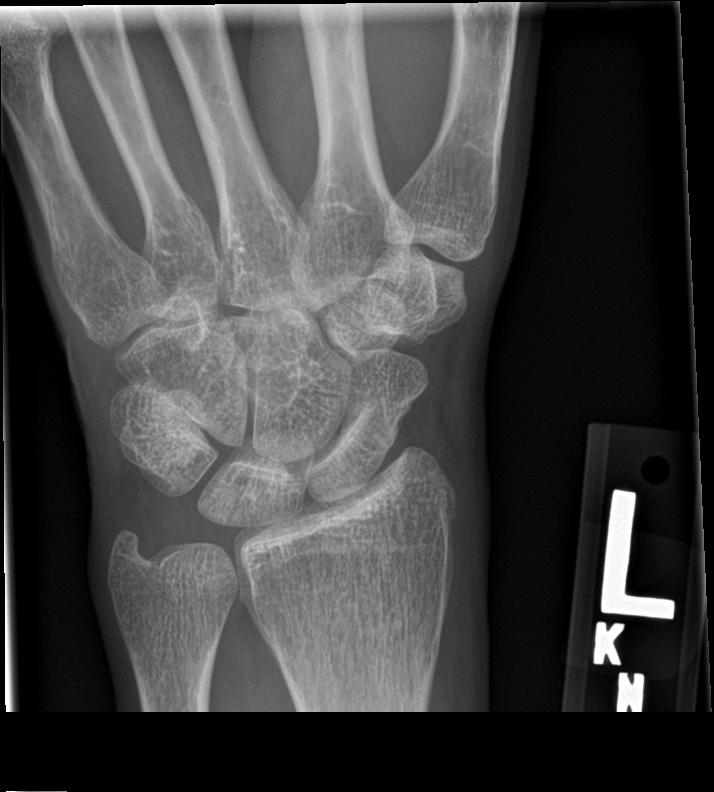

[4 of 4 positions shown; findings below may reference images not displayed]

FINDINGS: There is no evidence of fracture or dislocation. There is no
evidence of arthropathy or other focal bone abnormality. Mild soft
tissue swelling over the dorsum of the wrist.
IMPRESSION: Soft tissue swelling over the dorsum of the wrist. No acute fracture
or joint dislocation.

## 2020-02-13 DEATH — deceased
